# Patient Record
Sex: Male | Born: 1975 | Race: Black or African American | Hispanic: No | Marital: Single | Smoking: Never smoker
Health system: Southern US, Community
[De-identification: ages and names within clinical notes are randomized; demographics above are authoritative.]

---

## 2008-05-11 ENCOUNTER — Emergency Department: Payer: Self-pay | Admitting: Emergency Medicine

## 2009-04-06 ENCOUNTER — Ambulatory Visit: Payer: Self-pay | Admitting: Internal Medicine
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
## 2010-12-23 ENCOUNTER — Ambulatory Visit: Payer: Self-pay

## 2011-05-29 ENCOUNTER — Ambulatory Visit: Payer: Self-pay | Admitting: Internal Medicine

## 2013-07-09 ENCOUNTER — Ambulatory Visit: Payer: Self-pay

## 2014-09-25 IMAGING — CR DG LUMBAR SPINE COMPLETE 4+V
1 series · 5 of 5 positions shown · non-contrast
Comparison: None.

CLINICAL DATA: Status post fall.  Back pain.

EXAM:
LUMBAR SPINE - COMPLETE 4+ VIEW

[Series 1: ap · 0.17mm/px · 5 of 5 slices shown]
[im 1/5]
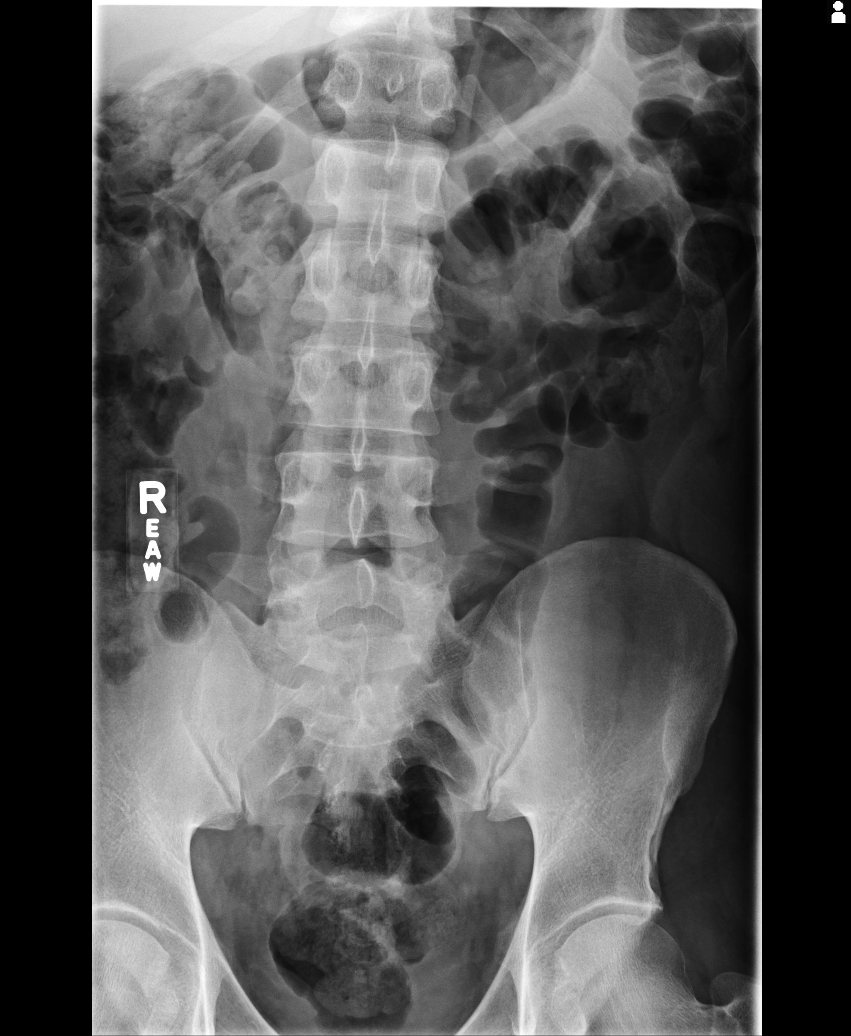
[im 2/5]
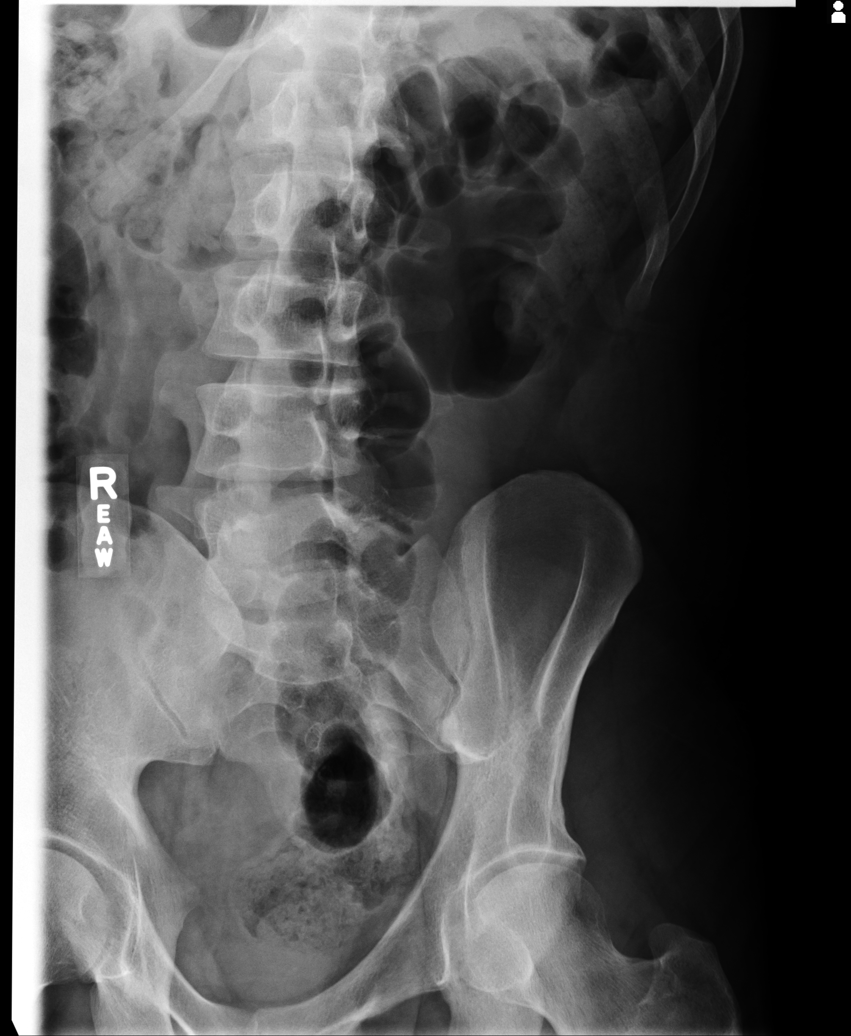
[im 3/5]
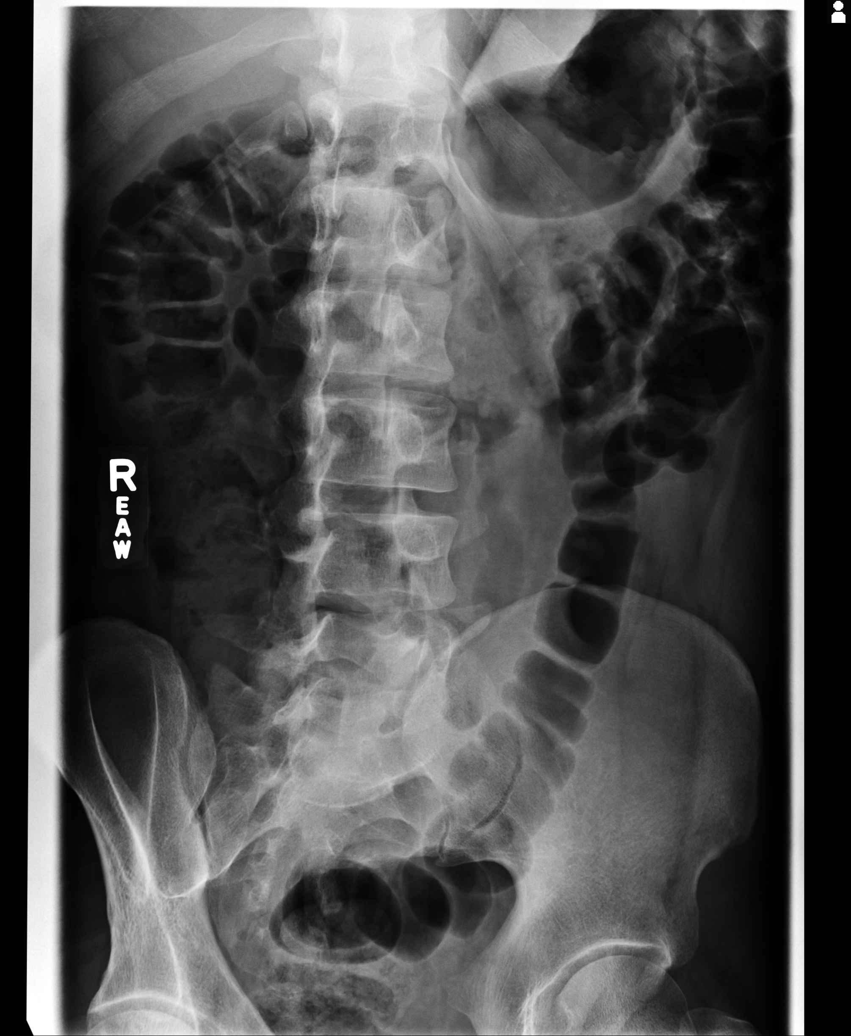
[im 4/5]
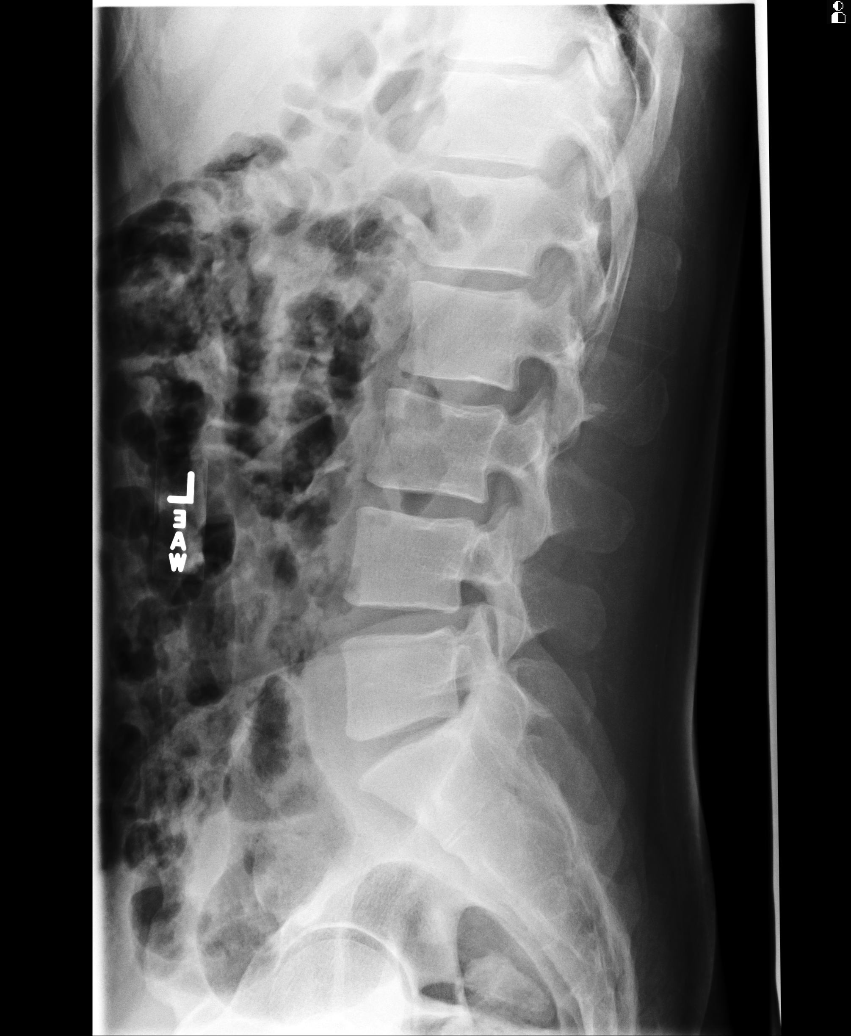
[im 5/5]
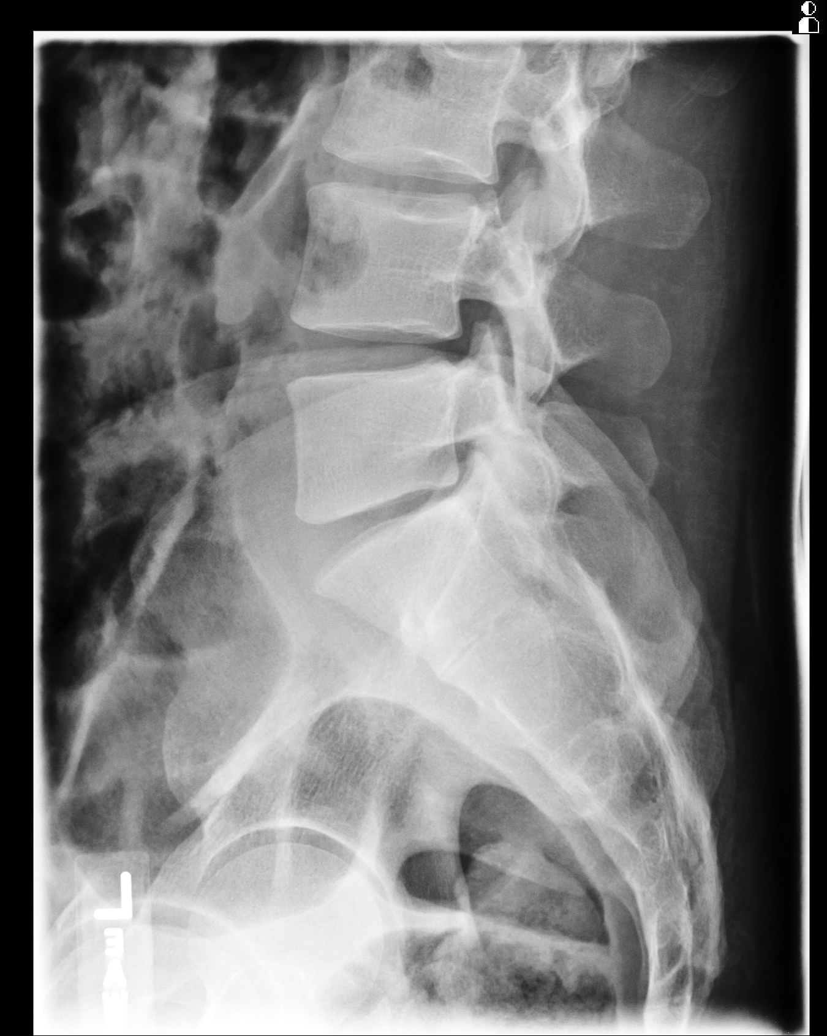

[5 of 5 positions shown; findings below may reference images not displayed]

FINDINGS: Vertebral body height and alignment are normal. Intervertebral disc
space height is maintained. No pars interarticularis defect is
identified. Paraspinous structures are unremarkable.
IMPRESSION: Normal exam.

## 2017-05-18 ENCOUNTER — Other Ambulatory Visit: Payer: Self-pay

## 2017-05-18 ENCOUNTER — Ambulatory Visit
Admission: EM | Admit: 2017-05-18 | Discharge: 2017-05-18 | Disposition: A | Discharge status: Home / Self Care | Payer: BLUE CROSS/BLUE SHIELD | Attending: Family Medicine | Admitting: Family Medicine

## 2017-05-18 DIAGNOSIS — R300 Dysuria: Secondary | ICD-10-CM | POA: Diagnosis not present

## 2017-05-18 DIAGNOSIS — N390 Urinary tract infection, site not specified: Secondary | ICD-10-CM | POA: Diagnosis not present

## 2017-05-18 DIAGNOSIS — R399 Unspecified symptoms and signs involving the genitourinary system: Secondary | ICD-10-CM

## 2017-05-18 DIAGNOSIS — M545 Low back pain: Secondary | ICD-10-CM

## 2017-05-18 LAB — CHLAMYDIA/NGC RT PCR (ARMC ONLY)
Chlamydia Tr: NOT DETECTED
N gonorrhoeae: NOT DETECTED

## 2017-05-18 LAB — URINALYSIS, COMPLETE (UACMP) WITH MICROSCOPIC
BACTERIA UA: NONE SEEN
Bilirubin Urine: NEGATIVE
GLUCOSE, UA: NEGATIVE mg/dL
Hgb urine dipstick: NEGATIVE
KETONES UR: NEGATIVE mg/dL
LEUKOCYTES UA: NEGATIVE
Nitrite: NEGATIVE
Protein, ur: NEGATIVE mg/dL
RBC / HPF: NONE SEEN RBC/hpf (ref 0–5)
SQUAMOUS EPITHELIAL / LPF: NONE SEEN
Specific Gravity, Urine: 1.015 (ref 1.005–1.030)
WBC UA: NONE SEEN WBC/hpf (ref 0–5)
pH: 5.5 (ref 5.0–8.0)

## 2017-05-18 MED ORDER — CIPROFLOXACIN HCL 500 MG PO TABS: 500.0000 mg | ORAL_TABLET | Freq: Two times a day (BID) | ORAL | 0 refills | Status: AC

## 2017-05-18 MED ORDER — TAMSULOSIN HCL 0.4 MG PO CAPS: 0.4000 mg | ORAL_CAPSULE | Freq: Every day | ORAL | 3 refills | Status: AC

## 2017-05-18 NOTE — ED Provider Notes (Signed)
[name]    CSN: [id number] Arrival date & time: 05/18/17  0830  History   Chief Complaint Chief Complaint  Patient presents with  . Dysuria   HPI  42 year old male presents with the above complaint.  Patient reports that he has had pain after urination for the past week.  He reports that he has had some low back pain and stopping and starting when he urinates.  He also endorses groin pain but locates the pain to his upper thigh.  Patient states that he has a history of prostatitis.  No fevers or chills.  No known exacerbating relieving factors.  Patient is in no pain currently.  No other associated symptoms.  No other complaints.  PMH - Hx of prostatitis   Surgical Hx - No past surgeries.  Home Medications    Prior to Admission medications   Medication Sig Start Date End Date Taking? Authorizing Provider  ciprofloxacin (CIPRO) 500 MG tablet Take 1 tablet (500 mg total) by mouth 2 (two) times daily. 05/18/17   Frank Sams, DO  tamsulosin (FLOMAX) 0.4 MG CAPS capsule Take 1 capsule (0.4 mg total) by mouth daily. 05/18/17   Frank Sams, DO   Family History No reported family hx.  Social History Social History   Tobacco Use  . Smoking status: Never Smoker  . Smokeless tobacco: Never Used  Substance Use Topics  . Alcohol use: No    Frequency: Never  . Drug use: No    Allergies   Patient has no known allergies.  Review of Systems Review of Systems  Constitutional: Negative for fever.  Genitourinary: Positive for difficulty urinating and dysuria.  Musculoskeletal: Positive for back pain.   Physical Exam Triage Vital Signs ED Triage Vitals  Enc Vitals Group     BP 05/18/17 0939 (!) 152/91     Pulse Rate 05/18/17 0939 86     Resp --      Temp 05/18/17 0939 97.8 F (36.6 C)     Temp Source 05/18/17 0939 Oral     SpO2 05/18/17 0939 100 %     Weight 05/18/17 0938 200 lb (90.7 kg)     Height 05/18/17 0938 6\' 1"  (1.854 m)     Head Circumference --        Peak Flow --      Pain Score --      Pain Loc --      Pain Edu? --      Excl. in GC? --    Updated Vital Signs BP (!) 152/91 (BP Location: Left Arm)   Pulse 86   Temp 97.8 F (36.6 C) (Oral)   Ht 6\' 1"  (1.854 m)   Wt 200 lb (90.7 kg)   SpO2 100%   BMI 26.39 kg/m  Physical Exam  Constitutional: He is oriented to person, place, and time. He appears well-developed. No distress.  Cardiovascular: Normal rate and regular rhythm.  Pulmonary/Chest: Effort normal and breath sounds normal. He has no wheezes. He has no rales.  Abdominal: Soft. He exhibits no distension. There is no tenderness. Hernia confirmed negative in the right inguinal area and confirmed negative in the left inguinal area.  Genitourinary: Testes normal and penis normal. Circumcised.  Lymphadenopathy: No inguinal adenopathy noted on the right or left side.  Neurological: He is alert and oriented to person, place, and time.  Psychiatric: He has a normal mood and affect. His behavior is normal.  Nursing note and vitals  reviewed.  UC Treatments / Results  Labs (all labs ordered are listed, but only abnormal results are displayed) Labs Reviewed  CHLAMYDIA/NGC RT PCR ([facility] ONLY)  URINALYSIS, COMPLETE ([facility]ACMP) WITH MICROSCOPIC    EKG  EKG Interpretation None       Radiology No results found.  Procedures Procedures (including critical care time)  Medications Ordered in UC Medications - No data to display   Initial Impression / Assessment and Plan / UC Course  I have reviewed the triage vital signs and the nursing notes.  Pertinent labs & imaging results that were available during my care of the patient were reviewed by me and considered in my medical decision making (see chart for details).     42 year old L presents with lower urinary tract symptoms.  Urinalysis normal.  Suspect BPH and possibly early prostatitis.  Treating with Cipro and Flomax.  Final Clinical Impressions(s) / UC  Diagnoses   Final diagnoses:  Lower urinary tract symptoms (LUTS)    ED Discharge Orders        Ordered    ciprofloxacin (CIPRO) 500 MG tablet  2 times daily     05/18/17 1052    tamsulosin (FLOMAX) 0.4 MG CAPS capsule  Daily     05/18/17 1052     Controlled Substance Prescriptions [place] Controlled Substance Registry consulted? Not Applicable   Frank SamsCook, Frank Schuelke G, DO 05/18/17 1105

## 2017-05-18 NOTE — ED Triage Notes (Signed)
Patient is c/o lower back pain, groin pain, dysuria x 1 week.

## 2017-05-18 NOTE — Discharge Instructions (Signed)
Cipro as prescribed.  Flomax daily.  If persists or worsens, see Urology.  Take care  Dr. Adriana Simasook
